# Patient Record
Sex: Female | Born: 1965 | Race: White | Hispanic: No | Marital: Married | Smoking: Never smoker
Health system: Southern US, Community
[De-identification: ages and names within clinical notes are randomized; demographics above are authoritative.]

## PROBLEM LIST (undated history)

## (undated) DIAGNOSIS — M8580 Other specified disorders of bone density and structure, unspecified site: Secondary | ICD-10-CM

## (undated) DIAGNOSIS — J302 Other seasonal allergic rhinitis: Secondary | ICD-10-CM

## (undated) DIAGNOSIS — Z862 Personal history of diseases of the blood and blood-forming organs and certain disorders involving the immune mechanism: Secondary | ICD-10-CM

## (undated) DIAGNOSIS — T7840XA Allergy, unspecified, initial encounter: Secondary | ICD-10-CM

## (undated) DIAGNOSIS — D649 Anemia, unspecified: Secondary | ICD-10-CM

## (undated) DIAGNOSIS — K635 Polyp of colon: Secondary | ICD-10-CM

## (undated) DIAGNOSIS — C4491 Basal cell carcinoma of skin, unspecified: Secondary | ICD-10-CM

## (undated) HISTORY — DX: Personal history of diseases of the blood and blood-forming organs and certain disorders involving the immune mechanism: Z86.2

## (undated) HISTORY — DX: Anemia, unspecified: D64.9

## (undated) HISTORY — DX: Other seasonal allergic rhinitis: J30.2

## (undated) HISTORY — DX: Allergy, unspecified, initial encounter: T78.40XA

## (undated) HISTORY — DX: Other specified disorders of bone density and structure, unspecified site: M85.80

## (undated) HISTORY — DX: Basal cell carcinoma of skin, unspecified: C44.91

## (undated) HISTORY — DX: Polyp of colon: K63.5

---

## 1995-08-03 DIAGNOSIS — C4491 Basal cell carcinoma of skin, unspecified: Secondary | ICD-10-CM

## 1995-08-03 HISTORY — DX: Basal cell carcinoma of skin, unspecified: C44.91

## 1996-08-02 HISTORY — PX: DILATION AND CURETTAGE OF UTERUS: SHX78

## 2004-03-19 ENCOUNTER — Other Ambulatory Visit: Admission: RE | Admit: 2004-03-19 | Discharge: 2004-03-19 | Payer: Self-pay | Admitting: Obstetrics and Gynecology

## 2005-06-03 ENCOUNTER — Other Ambulatory Visit: Admission: RE | Admit: 2005-06-03 | Discharge: 2005-06-03 | Payer: Self-pay | Admitting: Obstetrics and Gynecology

## 2005-07-15 ENCOUNTER — Ambulatory Visit: Payer: Self-pay | Admitting: Internal Medicine

## 2005-08-27 ENCOUNTER — Encounter (INDEPENDENT_AMBULATORY_CARE_PROVIDER_SITE_OTHER): Payer: Self-pay | Admitting: Specialist

## 2005-08-27 ENCOUNTER — Ambulatory Visit: Payer: Self-pay | Admitting: Internal Medicine

## 2005-08-27 DIAGNOSIS — D126 Benign neoplasm of colon, unspecified: Secondary | ICD-10-CM | POA: Insufficient documentation

## 2007-09-14 ENCOUNTER — Ambulatory Visit: Payer: Self-pay | Admitting: Gastroenterology

## 2007-09-14 DIAGNOSIS — D509 Iron deficiency anemia, unspecified: Secondary | ICD-10-CM

## 2007-09-14 LAB — CONVERTED CEMR LAB
Eosinophils Absolute: 0.1 10*3/uL (ref 0.0–0.6)
Lymphocytes Relative: 26.3 % (ref 12.0–46.0)
MCHC: 33.2 g/dL (ref 30.0–36.0)
MCV: 89.1 fL (ref 78.0–100.0)
Monocytes Relative: 10.8 % (ref 3.0–11.0)
Neutro Abs: 3.8 10*3/uL (ref 1.4–7.7)
Platelets: 220 10*3/uL (ref 150–400)
Sed Rate: 47 mm/hr — ABNORMAL HIGH (ref 0–25)

## 2007-09-15 ENCOUNTER — Encounter: Admission: RE | Admit: 2007-09-15 | Discharge: 2007-09-15 | Payer: Self-pay | Admitting: Gastroenterology

## 2007-09-15 ENCOUNTER — Ambulatory Visit: Payer: Self-pay | Admitting: Internal Medicine

## 2007-10-11 ENCOUNTER — Ambulatory Visit: Payer: Self-pay | Admitting: Internal Medicine

## 2007-11-08 ENCOUNTER — Ambulatory Visit: Payer: Self-pay | Admitting: Internal Medicine

## 2007-11-08 DIAGNOSIS — R1031 Right lower quadrant pain: Secondary | ICD-10-CM

## 2007-11-08 LAB — CONVERTED CEMR LAB
Eosinophils Absolute: 0 10*3/uL (ref 0.0–0.7)
Eosinophils Relative: 0.6 % (ref 0.0–5.0)
Monocytes Relative: 9.2 % (ref 3.0–12.0)
Neutrophils Relative %: 57.2 % (ref 43.0–77.0)
Platelets: 220 10*3/uL (ref 150–400)
Sed Rate: 17 mm/hr (ref 0–22)
WBC: 5.3 10*3/uL (ref 4.5–10.5)

## 2007-11-20 ENCOUNTER — Ambulatory Visit: Payer: Self-pay | Admitting: Cardiology

## 2007-12-04 ENCOUNTER — Telehealth: Payer: Self-pay | Admitting: Internal Medicine

## 2010-08-12 ENCOUNTER — Encounter: Payer: Self-pay | Admitting: Internal Medicine

## 2010-08-19 ENCOUNTER — Encounter (INDEPENDENT_AMBULATORY_CARE_PROVIDER_SITE_OTHER): Payer: Self-pay | Admitting: *Deleted

## 2010-09-03 NOTE — Letter (Signed)
Summary: Colonoscopy Letter  [facility]  [address] [place], [place] [address]   Phone: [phone]  Fax: [phone]      August 12, 2010 MRN: [id number]   [facility] [address] [place], [place]  [address]   Dear Ms. Benscoter,   According to your medical record, it is time for you to schedule a Colonoscopy. The American Cancer Society recommends this procedure as a method to detect early colon cancer. Patients with a family history of colon cancer, or a personal history of colon polyps or inflammatory bowel disease are at increased risk.  This letter has been generated based on the recommendations made at the time of your procedure. If you feel that in your particular situation this may no longer apply, please contact our office.  Please call our office at [phone] to schedule this appointment or to update your records at your earliest convenience.  Thank you for cooperating with [place] to provide you with the very best care possible.   Sincerely,  Hedwig Morton. Juanda Chance, M.D.  [facility] [phone]

## 2010-09-03 NOTE — Letter (Signed)
Summary: Pre Visit Letter Revised  [facility]  [address] [place], [place] [address]   Phone: [phone]  Fax: [phone]        08/19/2010 MRN: [id number] [facility] [address] [place], [place]  [address]             Procedure Date:  10-06-10   Welcome to the Gastroenterology Division at [facility].    You are scheduled to see a nurse for your pre-procedure visit on 09-22-10 at 1:00p.m. on the 3rd floor at [facility], [address]  We ask that you try to arrive at our office 15 minutes prior to your appointment time to allow for check-in.  Please take a minute to review the attached form.  If you answer "Yes" to one or more of the questions on the first page, we ask that you call the person listed at your earliest opportunity.  If you answer "No" to all of the questions, please complete the rest of the form and bring it to your appointment.    Your nurse visit will consist of discussing your medical and surgical history, your immediate family medical history, and your medications.   If you are unable to list all of your medications on the form, please bring the medication bottles to your appointment and we will list them.  We will need to be aware of both prescribed and over the counter drugs.  We will need to know exact dosage information as well.    Please be prepared to read and sign documents such as consent forms, a financial agreement, and acknowledgement forms.  If necessary, and with your consent, a friend or relative is welcome to sit-in on the nurse visit with you.  Please bring your insurance card so that we may make a copy of it.  If your insurance requires a referral to see a specialist, please bring your referral form from your primary care physician.  No co-pay is required for this nurse visit.     If you cannot keep your appointment, please call [phone] to cancel or reschedule prior to your appointment date.  This allows  [place] the opportunity to schedule an appointment for another patient in need of care.    Thank you for choosing [facility] for your medical needs.  We appreciate the opportunity to care for you.  Please visit [place] at our website  to learn more about our practice.  Sincerely, The Gastroenterology Division

## 2010-09-21 ENCOUNTER — Encounter (INDEPENDENT_AMBULATORY_CARE_PROVIDER_SITE_OTHER): Payer: Self-pay | Admitting: *Deleted

## 2010-09-22 ENCOUNTER — Encounter: Payer: Self-pay | Admitting: Internal Medicine

## 2010-09-29 NOTE — Letter (Signed)
Summary: [facility] Instructions  [facility]  [address] [place], [place] [address]   Phone: [phone]  Fax: [phone]       Kendra Walter    10/18/1965    MRN: [id number]       Procedure Day Dorna Bloom:  Jake Shark  10/06/10     Arrival Time: 8:00AM     Procedure Time:  9:00AM     Location of Procedure:                    _X_  [facility] (4th Floor)   PREPARATION FOR COLONOSCOPY WITH MIRALAX  Starting 5 days prior to your procedure 10/01/10 do not eat nuts, seeds, popcorn, corn, beans, peas,  salads, or any raw vegetables.  Do not take any fiber supplements (e.g. Metamucil, Citrucel, and Benefiber). ____________________________________________________________________________________________________   THE DAY BEFORE YOUR PROCEDURE         DATE: 10/05/10  DAY: MONDAY  1   Drink clear liquids the entire day-NO SOLID FOOD  2   Do not drink anything colored red or purple.  Avoid juices with pulp.  No orange juice.  3   Drink at least 64 oz. (8 glasses) of fluid/clear liquids during the day to prevent dehydration and help the prep work efficiently.  CLEAR LIQUIDS INCLUDE: Water Jello Ice Popsicles Tea (sugar ok, no milk/cream) Powdered fruit flavored drinks Coffee (sugar ok, no milk/cream) Gatorade Juice: apple, white grape, white cranberry  Lemonade Clear bullion, consomm, broth Carbonated beverages (any kind) Strained chicken noodle soup Hard Candy  4   Mix the entire bottle of Miralax with 64 oz. of Gatorade/Powerade in the morning and put in the refrigerator to chill.  5   At 3:00 pm take 2 Dulcolax/Bisacodyl tablets.  6   At 4:30 pm take one Reglan/Metoclopramide tablet.  7  Starting at 5:00 pm drink one 8 oz glass of the Miralax mixture every 15-20 minutes until you have finished drinking the entire 64 oz.  You should finish drinking prep around 7:30 or 8:00 pm.  8   If you are nauseated, you may take the 2nd Reglan/Metoclopramide tablet  at 6:30 pm.        9    At 8:00 pm take 2 more DULCOLAX/Bisacodyl tablets.     THE DAY OF YOUR PROCEDURE      DATE:  10/06/10   DAY: Jake Shark  You may drink clear liquids until 7:00AM  (2 HOURS BEFORE PROCEDURE).   MEDICATION INSTRUCTIONS  Unless otherwise instructed, you should take regular prescription medications with a small sip of water as early as possible the morning of your procedure.        OTHER INSTRUCTIONS  You will need a responsible adult at least 46 years of age to accompany you and drive you home.   This person must remain in the waiting room during your procedure.  Wear loose fitting clothing that is easily removed.  Leave jewelry and other valuables at home.  However, you may wish to bring a book to read or an iPod/MP3 player to listen to music as you wait for your procedure to start.  Remove all body piercing jewelry and leave at home.  Total time from sign-in until discharge is approximately 2-3 hours.  You should go home directly after your procedure and rest.  You can resume normal activities the day after your procedure.  The day of your procedure you should not:   Drive   Make  legal decisions   Operate machinery   Drink alcohol   Return to work  You will receive specific instructions about eating, activities and medications before you leave.   The above instructions have been reviewed and explained to me by   Wyona Almas RN  September 22, 2010 1:17 PM     I fully understand and can verbalize these instructions _____________________________ Date _______

## 2010-09-29 NOTE — Miscellaneous (Signed)
Summary: [facility] Previsit/prep  Clinical Lists Changes  Medications: Added new medication of DULCOLAX 5 MG  TBEC (BISACODYL) Day before procedure take 2 at 3pm and 2 at 8pm. - Signed Added new medication of METOCLOPRAMIDE HCL 10 MG  TABS (METOCLOPRAMIDE HCL) As per prep instructions. - Signed Added new medication of MIRALAX   POWD (POLYETHYLENE GLYCOL 3350) As per prep  instructions. - Signed Rx of DULCOLAX 5 MG  TBEC (BISACODYL) Day before procedure take 2 at 3pm and 2 at 8pm.;  #4 x 0;  Signed;  Entered by: Wyona Almas RN;  Authorized by: Hart Carwin MD;  Method used: Electronically to [facility] [address] [place], [place]  [address], Ph: [phone], Fax: [phone] Rx of METOCLOPRAMIDE HCL 10 MG  TABS (METOCLOPRAMIDE HCL) As per prep instructions.;  #2 x 0;  Signed;  Entered by: Wyona Almas RN;  Authorized by: Hart Carwin MD;  Method used: Electronically to [facility] [address] [place], [place]  [address], Ph: [phone], Fax: [phone] Rx of MIRALAX   POWD (POLYETHYLENE GLYCOL 3350) As per prep  instructions.;  #255gm x 0;  Signed;  Entered by: Wyona Almas RN;  Authorized by: Hart Carwin MD;  Method used: Electronically to [facility] [address] [place], [place]  [address], Ph: [phone], Fax: [phone] Observations: Added new observation of NKA: T (09/22/2010 12:48)    Prescriptions: MIRALAX   POWD (POLYETHYLENE GLYCOL 3350) As per prep  instructions.  #255gm x 0   Entered by:   Wyona Almas RN   Authorized by:   Hart Carwin MD   Signed by:   Wyona Almas RN on 09/22/2010   Method used:   Electronically to        [facility] [address] (retail)       [address]       [place], [place]  [address]       Ph: [phone]       Fax: [phone]   RxID:   [id number] METOCLOPRAMIDE HCL 10 MG  TABS (METOCLOPRAMIDE HCL) As per prep instructions.  #2 x 0   Entered by:   Wyona Almas RN  Authorized by:   Hart Carwin MD   Signed by:   Wyona Almas RN on 09/22/2010   Method used:   Electronically to        [facility] [address] (retail)       [address]       [place], [place]  [address]       Ph: [phone]       Fax: [phone]   RxID:   [id number] DULCOLAX 5 MG  TBEC (BISACODYL) Day before procedure take 2 at 3pm and 2 at 8pm.  #4 x 0   Entered by:   Wyona Almas RN   Authorized by:   Hart Carwin MD   Signed by:   Wyona Almas RN on 09/22/2010   Method used:   Electronically to        [facility] [address] (retail)       [address]       [place], [place]  [address]       Ph: [phone]       Fax: [phone]   RxID:   [id number]

## 2010-10-06 ENCOUNTER — Other Ambulatory Visit: Payer: Self-pay | Admitting: Internal Medicine

## 2010-10-06 ENCOUNTER — Other Ambulatory Visit (AMBULATORY_SURGERY_CENTER): Payer: BC Managed Care – PPO | Admitting: Internal Medicine

## 2010-10-06 DIAGNOSIS — Z1211 Encounter for screening for malignant neoplasm of colon: Secondary | ICD-10-CM

## 2010-10-06 DIAGNOSIS — D126 Benign neoplasm of colon, unspecified: Secondary | ICD-10-CM

## 2010-10-06 DIAGNOSIS — Z86010 Personal history of colonic polyps: Secondary | ICD-10-CM

## 2010-10-06 DIAGNOSIS — Z800 Family history of malignant neoplasm of digestive organs: Secondary | ICD-10-CM

## 2010-10-12 ENCOUNTER — Encounter: Payer: Self-pay | Admitting: Internal Medicine

## 2010-10-13 NOTE — Procedures (Addendum)
Summary: Colonoscopy  Patient: Kendra Walter Note: All result statuses are Final unless otherwise noted.  Tests: (1) Colonoscopy (COL)   COL Colonoscopy           DONE     [facility]     [address]     [place], [place]  [address]          COLONOSCOPY PROCEDURE REPORT          PATIENT:  Kendra Walter, Kendra Walter  MR#:  [id number]     BIRTHDATE:  1965-11-10, 44 yrs. old  GENDER:  female     ENDOSCOPIST:  Hedwig Morton. Juanda Chance, MD     REF. BY:  Harold Hedge, M.D.     PROCEDURE DATE:  10/06/2010     PROCEDURE:  Colonoscopy 62130     ASA CLASS:  Class I     INDICATIONS:  family history of colon cancer parent with colon     cancer, hyperplastic polyp in 2007     acute inflammatory process in RLQ in 2009, possibly a ruptured     enteric cyst or Meckel's     MEDICATIONS:   Versed 10 mg, Fentanyl 100 mcg          DESCRIPTION OF PROCEDURE:   After the risks benefits and     alternatives of the procedure were thoroughly explained, informed     consent was obtained.  Digital rectal exam was performed and     revealed no rectal masses.   The LB [id number]-[id number] endoscope     was introduced through the anus and advanced to the cecum, which     was identified by both the appendix and ileocecal valve, without     limitations.  The quality of the prep was good, using MiraLax.     The instrument was then slowly withdrawn as the colon was fully     examined.     <<PROCEDUREIMAGES>>          FINDINGS:  Four polyps were found. at 110 cm 2 flat polyps 4-5 mm     each, at 15 and 30 cm 2 diminutive polyps remloved The polyps were     removed using cold biopsy forceps (see image4 and image5).  This     was otherwise a normal examination of the colon (see image6,     image3, image2, and image1).   Retroflexed views in the rectum     revealed no abnormalities.    The scope was then withdrawn from     the patient and the procedure completed.          COMPLICATIONS:  None     ENDOSCOPIC  IMPRESSION:     1) Four polyps     2) Otherwise normal examination     s/p polypectomy x 4     RECOMMENDATIONS:     1) Await pathology results     2) High fiber diet.     REPEAT EXAM:  In 5 year(s) for.          ______________________________     Hedwig Morton. Juanda Chance, MD          CC:          n.     eSIGNED:   Hedwig Morton. Brodie at 10/06/2010 09:44 AM          Donn Pierini, [id number]  Note: An exclamation mark (!) indicates a result that was not dispersed into the flowsheet. Document Creation Date:  10/06/2010 9:45 AM _______________________________________________________________________  (1) Order result status: Final Collection or observation date-time: 10/06/2010 09:35 Requested date-time:  Receipt date-time:  Reported date-time:  Referring Physician:   Ordering Physician: Lina Sar [phone]) Specimen Source:  Source: Launa Grill Order Number: [id number] Lab site:   Appended Document: Colonoscopy     Procedures Next Due Date:    Colonoscopy: 10/2015

## 2010-10-20 NOTE — Letter (Signed)
Summary: Patient Notice- Polyp Results   Gastroenterology  [address] [place], [place] [address]   Phone: [phone]  Fax: [phone]        October 12, 2010 MRN: [id number]    [facility] [address] [place], [place]  [address]    Dear Ms. 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  Should you develop new or worsening symptoms of abdominal pain, bowel habit changes or bleeding from the rectum or bowels, please schedule an evaluation with either your primary care physician or with me.  Additional information/recommendations:  _x_ No further action with gastroenterology is needed at this time. Please      follow-up with your primary care physician for your other healthcare      needs.  __ Please call [phone] to schedule a return visit to review your      situation.  __ Please keep your follow-up visit as already scheduled.  __ Continue treatment plan as outlined the day of your exam.  Please call us if you are having persistent problems or have questions about your condition that have not been fully answered at this time.  Sincerely,  Hart Carwin MD  This letter has been electronically signed by your physician.  Appended Document: Patient Notice- Polyp Results letter mailed

## 2010-12-15 NOTE — Assessment & Plan Note (Signed)
[facility]                         GASTROENTEROLOGY OFFICE NOTE   Kendra Walter, Kendra Walter                       MRN:          [id number]  DATE:10/11/2007                            DOB:          12-31-1965    Kendra Walter is a 45 year old white female with acute abdominal pain  requiring hospitalization on September 09, 2007 in [place] where she was  visiting.  The pain developed rather suddenly while driving to [facility]  and became excruciating by the time she reached [place].  She was seen  in the emergency room where CT scan of the abdomen showed inflammatory  change of the right lower quadrant.  She was hospitalized for 4 days,  but not treated with antibiotics.  Appendicitis was ruled out.  Pelvic  ultrasound was negative.  Initial CT scan showed cholelithiasis, but her  HIDA scan was negative.  She was kept to bowel rest and improved within  4 days, enough to go home.  CT scan was repeated here in [place] on  September 15, 2007, again with finding of a right lower quadrant  stranding with well-circumscribed rounded lesion with peripheral  enhancement of central low density measuring 23 x 27 mm.  There were no  tumors as this lesion connected to the bowel and it was certainly  separate from the appendix.  The patient was given a course of Flagyl  and Cipro, which she took for 2 weeks.  She is now 90% better having  some residual tenseness around the umbilical area and right lower  quadrant.  She is back on regular diet.  She has gained her weight back,  and she is having regular bowel movements.  There has been no fever at  any point.   MEDICATIONS:  Multiple vitamins and calcium.   PHYSICAL EXAMINATION:  Blood pressure 116/64.  Pulse 80.  Weight 120  pounds.  She was oriented, in no distress.  LUNGS:  Clear to auscultation.  CORE:  Normal S1 and normal S2.  ABDOMEN:  Soft, nondistended, with normoactive bowel sounds, tenderness  in right lower  quadrant.  No fullness, but definite tenderness in the  ileocecal area.   Last colonoscopy was January 2007 because of family history of colon  cancer.  Exam showed multiple polyps, mostly hyperplastic.   IMPRESSION:  A 45 year old white female with acute inflammatory process  in right lower quadrant documented on CT scan which responded  spontaneously and started to improve prior to taking antibiotics.  Rule  out ruptured cyst of cecal volvulus.  Rule out gynecological problem.  Doubt this was diverticulitis.  The patient had essentially normal  colonoscopy, except for polyps just 2 years ago.   PLAN:  Continue the same over the last 2 weeks.  CT scan of the abdomen  with attention to right lower quadrant in the next several weeks.  As  long as she is getting better we will continue on low dose Cipro 250  p.o. b.i.d. and see her in 4 weeks.  At that point I would repeat the CT  scan to see whether the process has resolved.  If not, consider repeat  colonoscopy or consider  small bowel follow through to determine ileum.  Also consider Meckel's  scan to rule out Meckel's diverticulitis.     Hedwig Morton. Juanda Chance, MD  Electronically Signed    DMB/MedQ  DD: 10/11/2007  DT: 10/12/2007  Job #: [id number]   cc:   Guy Sandifer. Henderson Cloud, M.D.

## 2010-12-15 NOTE — Assessment & Plan Note (Signed)
Kendra Walter                         GASTROENTEROLOGY OFFICE NOTE   Kendra Walter, Kendra Walter                       MRN:          [id number]  DATE:11/08/2007                            DOB:          1966/03/15    Kendra Walter is a 45 year old white female patient of Dr. Henderson Cloud who had  an acute abdominal event while visiting [place] in February 2009.  CT  scan at that time showed inflammatory process in the right lower  quadrant which was poorly defined, and raised a question of duplication  cyst or possibly an abscess, or possibly Meckel's diverticulitis.  She  responded to several weeks of antibiotics.  When we saw her in March  2009 she was improved.  At that point she took another 4 weeks of Cipro  250 p.o. b.i.d. which she has completed recently.  She is now completely  asymptomatic, being able to tolerate regular diet.  She denies any pain.  Her bowel habits have been regular.   We have seen Kendra Walter in the past for a family history of colon  cancer and colorectal screening.  She had a colonoscopy in 2007.  She  also has iron deficiency anemia.  Her sed rate was up to 47 on last  appointment.   PHYSICAL EXAMINATION:  Blood pressure 108/66, pulse 68, weight 119  pounds.  Lungs were clear to auscultation.  COR:  Normal S1, normal S2.  ABDOMEN:  Was soft, relaxed, flat with tenderness to the right of the  umbilicus.  It was a small area of about 3 square centimeters which was  somewhat uncomfortable on palpation, but I could not feel any  fluctuation.  There was no umbilical hernia, and the pain did not seem  to radiate to right lower quadrant.   IMPRESSION:  A 45 year old white female with acute abdominal event of  unknown etiology characterized by inflammatory process, possibly a cyst  or an abscess, in the right lower quadrant 2 months ago.  She has  responded by symptomatic relief after a prolonged course of antibiotics.   PLAN:  1. Repeat  CT scan of the abdomen and pelvis with attention to right      lower quadrant to look for resolution of the inflammatory changes      in the right pelvis  2. Depending on the results of the CT scan, consider Meckel's scan.  3. Today CBC and sed rate.     Hedwig Morton. Juanda Chance, MD  Electronically Signed    DMB/MedQ  DD: 11/08/2007  DT: 11/08/2007  Job #: [id number]   cc:   Guy Sandifer. Henderson Cloud, M.D.

## 2010-12-15 NOTE — Assessment & Plan Note (Signed)
[facility]                                 ON-CALL NOTE   Kendra, Walter                       MRN:          [id number]  DATE:09/15/2007                            DOB:          [birth year]    This is a patient of Hedwig Morton. Juanda Chance, M.D.   The patient had a CT scan done earlier today at [facility] CT scanner.  This was done for right lower quadrant pain.  She had been evaluated by  Mike Gip, PA-C and Vania Rea. Jarold Motto, MD, Clementeen Graham, FACP, FAGA in the  office earlier this week and this scan was arranged for.  The initial CT  scan raised the question of a possible abscess in her right lower  quadrant measuring 2-3 cm, but it was not of the greatest quality and so  she was called back by the radiologist to have a repeat scanner done.  Unfortunately when she was at the [facility], the CT scan machine  broke and so she then went to [facility] for a scan there.  I  spoke with Dr. Logan Bores who is the radiologist who read the CAT scan and  said that she does not have obvious inflammation, but there is a  somewhat rim enhancing fluid collection in her right lower quadrant of  unclear etiology and unclear clinical significance.  I spoke with the  patient on the phone and she actually feels very well.  She has had no  fevers or chills.  The pain that brought her to attention last week has  subsided.  It is still present and she does have mild tenderness when  she pushes on her abdomen, but this is all much better than she was one  week ago.  She has never had any infections in the past from talking  with her on the phone I cannot see why she would have any pocket of  infection currently.  Since she feels fairly well, the pain is better,  she has had no fevers and chills, I think it is safe to put her on oral  antibiotics and so I called her in a prescription for Ciprofloxacin 500  mg p.o. b.i.d. for 10 days as well as Flagyl 250 mg p.o. t.i.d. for  10  days.  She is going to call our office on Monday.  She already has a  scheduled appointment with Dr. Juanda Chance about a month from now, but I  think that it is probably best to evaluate her sooner than that in the  next week to two.  She knows that if pain gets worse or if she has any  trouble, that I am oncall on weekends and she should call then.     Rachael Fee, MD  Electronically Signed    DPJ/MedQ  DD: 09/15/2007  DT: 09/18/2007  Job #: [id number]   cc:   Hedwig Morton. Juanda Chance, MD  Vania Rea. Jarold Motto, MD, Caleen Essex, FAGA

## 2010-12-15 NOTE — Assessment & Plan Note (Signed)
[facility]                         GASTROENTEROLOGY OFFICE NOTE   Kendra, Walter                       MRN:          [id number]  DATE:09/14/2007                            DOB:          Aug 16, 1965    PROBLEM:  Abdominal pain times one week, constant.   HISTORY:  Kendra Walter is a pleasant, 45 year old white female, known to Dr.  Juanda Chance, who was last seen here in December of 2006.  She does have  family history of colon cancer in her mother and had undergone  colonoscopy with Dr. Juanda Chance in January of 2007.  She had multiple colon  polyps, which were removed, and plan was for followup in five years.  Her polyps were hyperplastic.   Patient has not had any previous abdominal surgery.  She presents now  with complaints of onset of abdominal pain six days ago, while she was  on her way to [facility] to visit.  She developed what she initially  thought was just a stomach ache in the mid-abdomen, but then this pain  became progressively more severe, and she was ultimately taken to the  emergency room outside of [place] by her friends.  She was admitted to  the hospital there and underwent workup, which was unrevealing.  They  suggested colonoscopy as the next step, but she was feeling a bit better  and wanted to return home to [facility] for further evaluation.  Over  this past four days, she says her pain is not quite as severe as it was  last weekend, but is persistent.  She has not had any nausea or  vomiting, no fever or chills, no diarrhea, melena or hematochezia.  The  pain is located in the mid-abdomen, below the umbilicus, and seems to be  aggravated by p.o. intake.  She says she has not been eating much, since  she is afraid she is going to make it worse.   What workup we have available shows that she had undergone a CT scan of  the abdomen without any contrast, which did reveal cholelithiasis, but  was otherwise unremarkable.  She had labs done  with a white count of 8,  hemoglobin 12.1, hematocrit of 36.7.  Urinalysis was negative.  Electrolytes unremarkable.  Liver function studies normal.  She also had  a HIDA scan done, which was unremarkable, and a pelvic ultrasound, which  showed a small amount of fluid in the cul-de-sac with inability to  identify the left ovary, otherwise negative.  Since returning home,  patient has been seen by Dr. Henderson Cloud, her gynecologist, had a pelvic  exam done which was negative, and was referred here for further  evaluation.   CURRENT MEDICATIONS:  Multivitamin and calcium supplement daily.   ALLERGIES:  No known drug allergies.   EXAM:  Well-developed, thin, white female, in no acute distress.  Weight is 119.2, blood pressure 100/62, pulse is 72.  HEENT:  Atraumatic, normocephalic, EOMI, PERLA.  Sclerae anicteric.  NECK:  Supple.  CARDIOVASCULAR:  Regular rate and rhythm with S1 and S2.  PULMONARY:  Clear to A and P.  ABDOMEN:  Soft.  She is tender to the right of midline, more in the  right mid quadrant, right lower quadrant.  There is no guarding or  rebound, no palpable mass or hepatosplenomegaly.  RECTAL EXAM:  Not done today.   IMPRESSION:  1. A 45 year old white female with one-week history of persistent mid      to right lower quadrant abdominal pain, etiology not clear.      Suspect that this is a small bowel or colonic process.  2. History of colon polyps.  3. Family history of colon cancer.   PLAN:  1. Schedule CT scan of the abdomen and pelvis with IV and oral      contrast.  2. Check CBC and sed rate today.  3. Offered patient analgesics, which she declined at this time.  4. Further plans pending CT.      Mike Gip, PA-C  Electronically Signed      Kendra Rea. Jarold Motto, MD, Caleen Essex, FAGA  Electronically Signed   AE/MedQ  DD: 09/14/2007  DT: 09/15/2007  Job #: [id number]   cc:   Hedwig Morton. Juanda Chance, MD  Guy Sandifer Henderson Cloud, M.D.

## 2011-10-25 ENCOUNTER — Other Ambulatory Visit: Payer: Self-pay | Admitting: Obstetrics and Gynecology

## 2011-10-25 DIAGNOSIS — R928 Other abnormal and inconclusive findings on diagnostic imaging of breast: Secondary | ICD-10-CM

## 2011-10-27 ENCOUNTER — Ambulatory Visit
Admission: RE | Admit: 2011-10-27 | Discharge: 2011-10-27 | Disposition: A | Discharge status: Home / Self Care | Payer: BC Managed Care – PPO | Source: Ambulatory Visit | Attending: Obstetrics and Gynecology | Admitting: Obstetrics and Gynecology

## 2011-10-27 DIAGNOSIS — R928 Other abnormal and inconclusive findings on diagnostic imaging of breast: Secondary | ICD-10-CM

## 2012-08-02 HISTORY — PX: LAPAROSCOPIC OOPHERECTOMY: SHX6507

## 2013-01-10 ENCOUNTER — Other Ambulatory Visit: Payer: Self-pay | Admitting: Obstetrics and Gynecology

## 2013-06-19 ENCOUNTER — Other Ambulatory Visit: Payer: Self-pay | Admitting: Obstetrics and Gynecology

## 2013-06-27 ENCOUNTER — Ambulatory Visit
Admission: RE | Admit: 2013-06-27 | Discharge: 2013-06-27 | Disposition: A | Discharge status: Home / Self Care | Payer: BC Managed Care – PPO | Source: Ambulatory Visit | Attending: Obstetrics and Gynecology | Admitting: Obstetrics and Gynecology

## 2013-06-27 ENCOUNTER — Other Ambulatory Visit: Payer: Self-pay | Admitting: Obstetrics and Gynecology

## 2013-07-04 ENCOUNTER — Other Ambulatory Visit: Payer: BC Managed Care – PPO

## 2014-12-06 ENCOUNTER — Other Ambulatory Visit (HOSPITAL_COMMUNITY): Payer: Self-pay | Admitting: Obstetrics and Gynecology

## 2014-12-07 IMAGING — US US BREAST*L*
1 series · 5 of 5 positions shown · non-contrast
Comparison: Prior studies from [REDACTED] dating back to
10/03/2008

CLINICAL DATA: Patient has felt a lump in the 12 o'clock position
of the left breast for 2 weeks.

EXAM:
DIGITAL DIAGNOSTIC LEFT MAMMOGRAM WITH CAD
ULTRASOUND LEFT BREAST

[Series 1: us breast*left* · 5 of 5 slices shown]
[im 1/5]
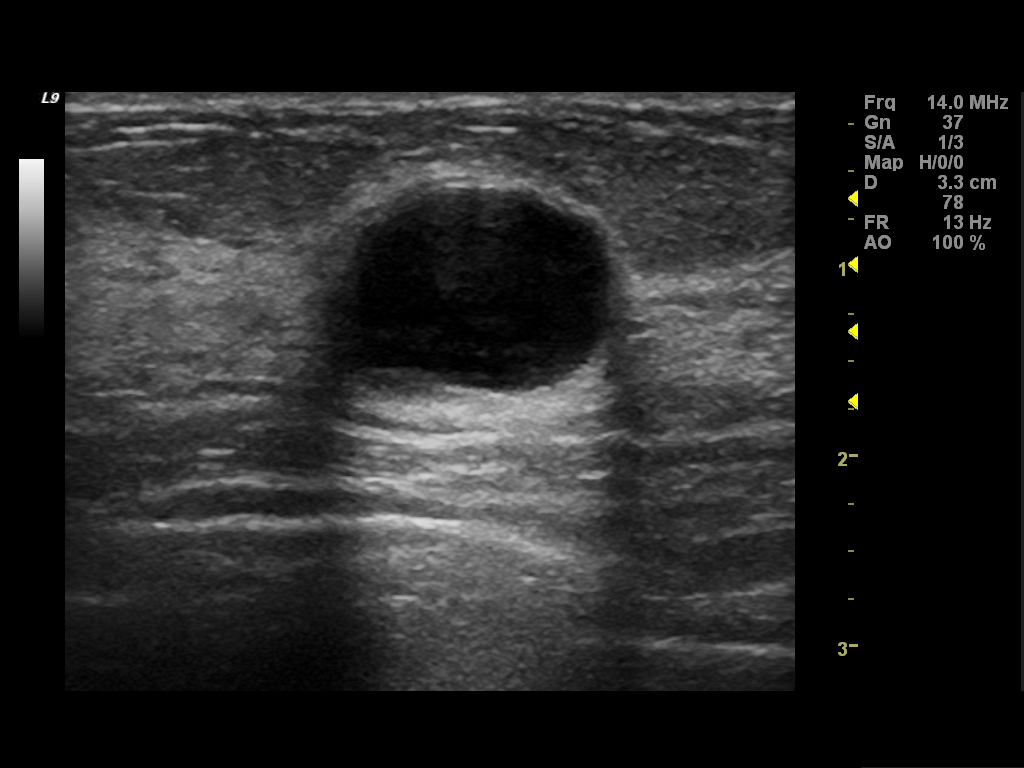
[im 2/5]
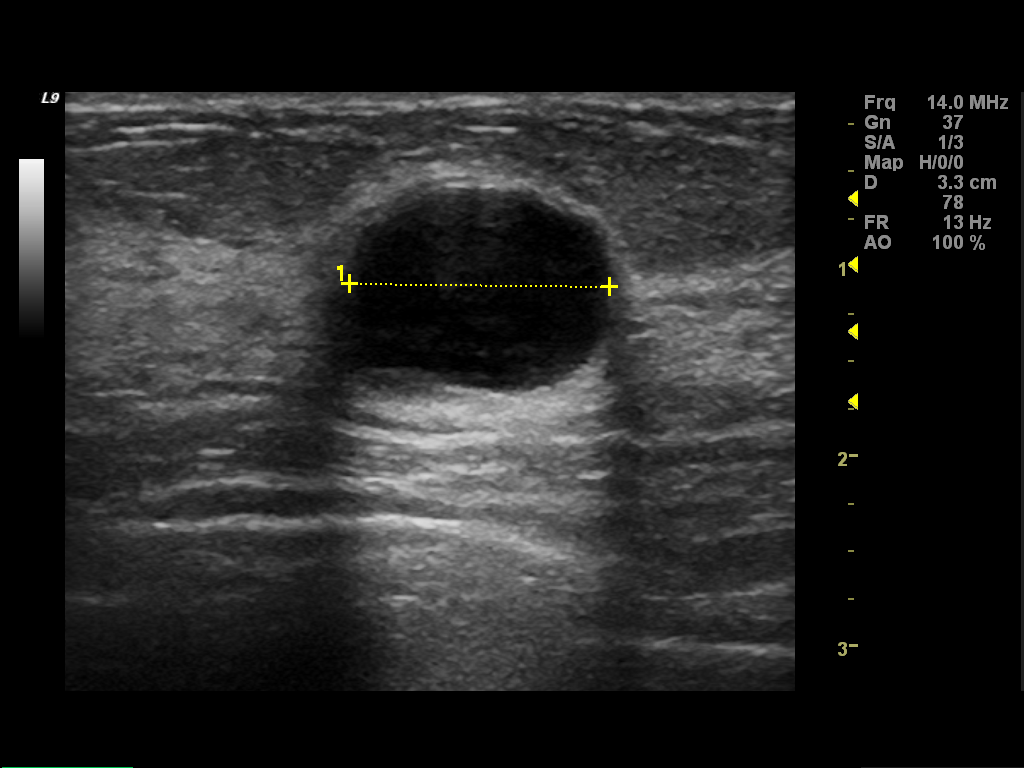
[im 3/5]
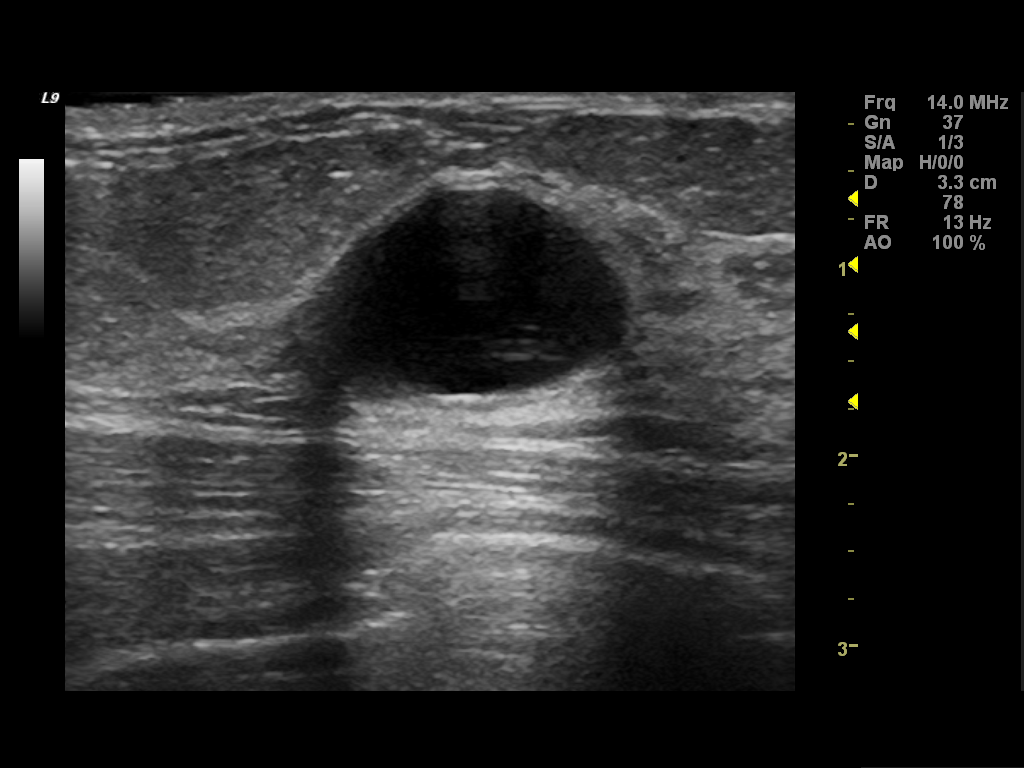
[im 4/5]
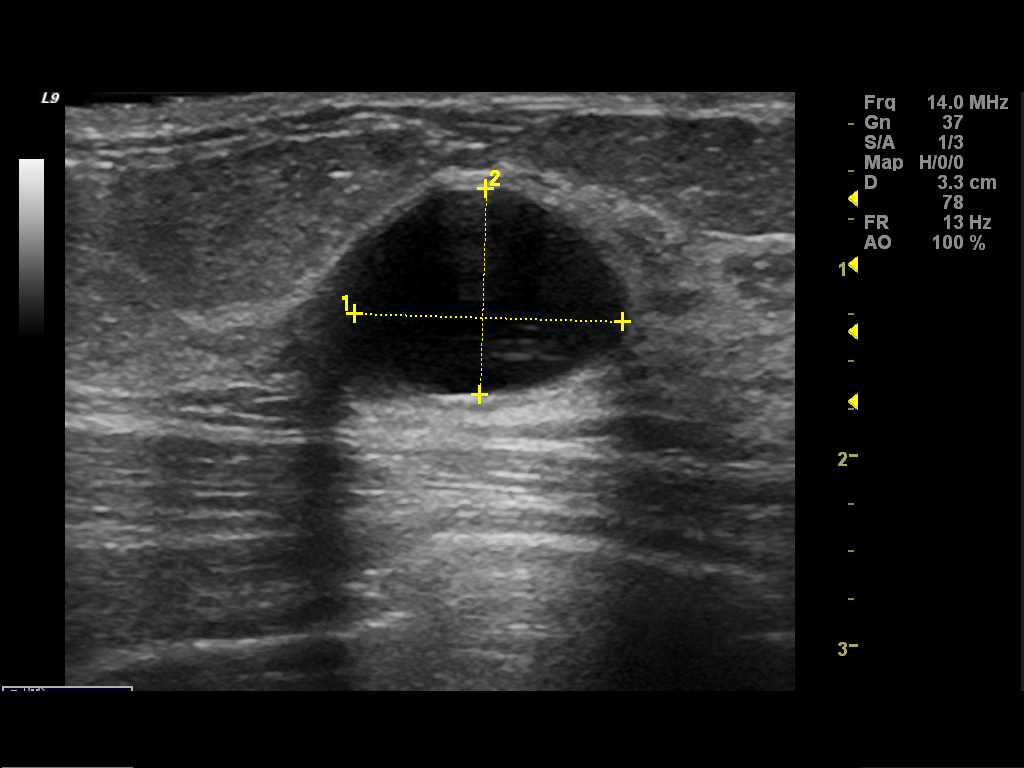
[im 5/5]
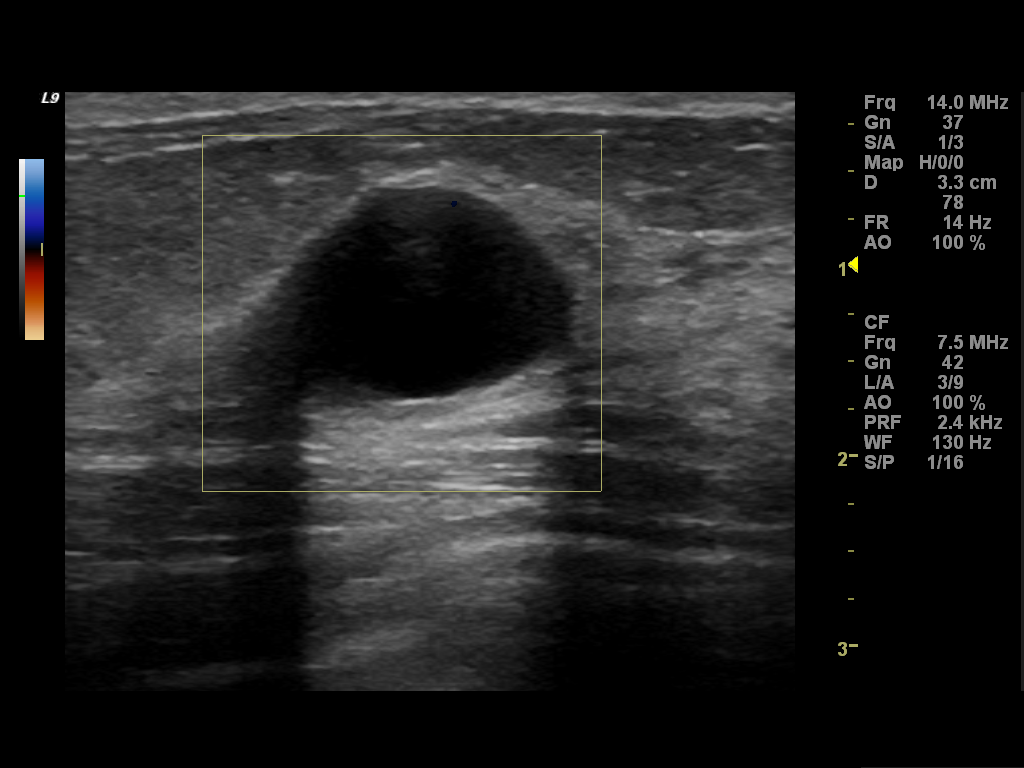

[5 of 5 positions shown; findings below may reference images not displayed]

ACR Breast Density Category c: The breasts are heterogeneously
dense, which may obscure small masses.
FINDINGS: Images of the right breast were inadvertently obtained. These are
interpreted as well. There is a rounded partially obscured partially
circumscribed mass in the 12 o'clock position of the left breast
which corresponds to the palpable abnormality. No other abnormality
is noted.

Mammographic images were processed with CAD.

On physical exam, I palpate a 1.5 cm mass at 12 o'clock 3 cm from
the left nipple. .

Ultrasound is performed, showing a simple cyst in this location
measuring 1.4 x 1.4 x 1.1 cm .
IMPRESSION: The palpable finding in the left breast corresponds to a benign
cyst. No mammographic or sonographic evidence of malignancy.

RECOMMENDATION:
Yearly screening mammography is suggested.

I have discussed the findings and recommendations with the patient.
Results were also provided in writing at the conclusion of the
visit.

BI-RADS CATEGORY  2: Benign Finding(s)

## 2014-12-09 LAB — CYTOLOGY - PAP

## 2015-02-06 ENCOUNTER — Encounter: Payer: Self-pay | Admitting: Internal Medicine

## 2015-10-17 ENCOUNTER — Encounter: Payer: Self-pay | Admitting: Gastroenterology

## 2015-10-31 ENCOUNTER — Encounter: Payer: Self-pay | Admitting: Gastroenterology

## 2015-11-28 ENCOUNTER — Ambulatory Visit (AMBULATORY_SURGERY_CENTER): Payer: Self-pay | Admitting: *Deleted

## 2015-11-28 VITALS — Ht 62.0 in | Wt 131.4 lb

## 2015-11-28 DIAGNOSIS — Z86010 Personal history of colonic polyps: Secondary | ICD-10-CM

## 2015-11-28 NOTE — Progress Notes (Signed)
No allergies to eggs or soy. No problems with anesthesia.  Pt given Emmi instructions for colonoscopy  No oxygen use  No diet drug use  

## 2015-12-01 ENCOUNTER — Encounter: Payer: Self-pay | Admitting: Gastroenterology

## 2015-12-12 ENCOUNTER — Ambulatory Visit (AMBULATORY_SURGERY_CENTER): Payer: 59 | Admitting: Gastroenterology

## 2015-12-12 ENCOUNTER — Encounter: Payer: Self-pay | Admitting: Gastroenterology

## 2015-12-12 VITALS — BP 113/66 | HR 50 | Temp 97.5°F | Resp 13 | Ht 62.0 in | Wt 131.0 lb

## 2015-12-12 DIAGNOSIS — Z800 Family history of malignant neoplasm of digestive organs: Secondary | ICD-10-CM

## 2015-12-12 DIAGNOSIS — Z86010 Personal history of colonic polyps: Secondary | ICD-10-CM

## 2015-12-12 DIAGNOSIS — D128 Benign neoplasm of rectum: Secondary | ICD-10-CM | POA: Diagnosis not present

## 2015-12-12 DIAGNOSIS — K635 Polyp of colon: Secondary | ICD-10-CM

## 2015-12-12 DIAGNOSIS — D120 Benign neoplasm of cecum: Secondary | ICD-10-CM

## 2015-12-12 HISTORY — DX: Polyp of colon: K63.5

## 2015-12-12 MED ORDER — SODIUM CHLORIDE 0.9 % IV SOLN: 500.0000 mL | INTRAVENOUS | Status: DC

## 2015-12-12 NOTE — Op Note (Signed)
[facility] Patient Name: Kendra Walter Procedure Date: 12/12/2015 1:54 PM MRN: [id number] Endoscopist: Mauri Pole , MD Age: 50 Referring MD:  Date of Birth: [birth year] Gender: Female Procedure:                Colonoscopy Indications:              Surveillance: Personal history of adenomatous                            polyps on last colonoscopy > 5 years ago, Family                            history of colon cancer in a first-degree relative Medicines:                Monitored Anesthesia Care Procedure:                Pre-Anesthesia Assessment:                           - Prior to the procedure, a History and Physical                            was performed, and patient medications and                            allergies were reviewed. The patient's tolerance of                            previous anesthesia was also reviewed. The risks                            and benefits of the procedure and the sedation                            options and risks were discussed with the patient.                            All questions were answered, and informed consent                            was obtained. Prior Anticoagulants: The patient has                            taken no previous anticoagulant or antiplatelet                            agents. ASA Grade Assessment: II - A patient with                            mild systemic disease. After reviewing the risks                            and benefits, the patient was deemed in  satisfactory condition to undergo the procedure.                           After obtaining informed consent, the colonoscope                            was passed under direct vision. Throughout the                            procedure, the patient's blood pressure, pulse, and                            oxygen saturations were monitored continuously. The                            Model CF-HQ190L [id number])  scope was introduced                            through the anus and advanced to the the terminal                            ileum, with identification of the appendiceal                            orifice and IC valve. The colonoscopy was performed                            without difficulty. The patient tolerated the                            procedure well. The quality of the bowel                            preparation was excellent. The terminal ileum,                            ileocecal valve, appendiceal orifice, and rectum                            were photographed. Scope In: 2:03:12 PM Scope Out: 2:21:11 PM Scope Withdrawal Time: 0 hours 10 minutes 6 seconds  Total Procedure Duration: 0 hours 17 minutes 59 seconds  Findings:                 The perianal and digital rectal examinations were                            normal.                           Three sessile polyps were found in the rectum and                            appendiceal orifice. The polyps were 2 to 4 mm in  size. These polyps were removed with a cold biopsy                            forceps. Resection and retrieval were complete.                           Non-bleeding internal hemorrhoids were found during                            retroflexion. The hemorrhoids were small. Complications:            No immediate complications. Estimated Blood Loss:     Estimated blood loss: none. Impression:               - Three 2 to 4 mm polyps in the rectum and at the                            appendiceal orifice, removed with a cold biopsy                            forceps. Resected and retrieved.                           - Non-bleeding internal hemorrhoids. Recommendation:           - Patient has a contact number available for                            emergencies. The signs and symptoms of potential                            delayed complications were discussed with the                             patient. Return to normal activities tomorrow.                            Written discharge instructions were provided to the                            patient.                           - Resume previous diet.                           - Continue present medications.                           - Await pathology results.                           - Repeat colonoscopy in 5 years for surveillance. Mauri Pole, MD 12/12/2015 2:40:41 PM This report has been signed electronically.

## 2015-12-12 NOTE — Progress Notes (Signed)
Report to PACU, RN, vss, BBS= Clear.  

## 2015-12-12 NOTE — Patient Instructions (Signed)
YOU HAD AN ENDOSCOPIC PROCEDURE TODAY AT THE [facility]:   Refer to the procedure report that was given to you for any specific questions about what was found during the examination.  If the procedure report does not answer your questions, please call your gastroenterologist to clarify.  If you requested that your care partner not be given the details of your procedure findings, then the procedure report has been included in a sealed envelope for you to review at your convenience later.  YOU SHOULD EXPECT: Some feelings of bloating in the abdomen. Passage of more gas than usual.  Walking can help get rid of the air that was put into your GI tract during the procedure and reduce the bloating. If you had a lower endoscopy (such as a colonoscopy or flexible sigmoidoscopy) you may notice spotting of blood in your stool or on the toilet paper. If you underwent a bowel prep for your procedure, you may not have a normal bowel movement for a few days.  Please Note:  You might notice some irritation and congestion in your nose or some drainage.  This is from the oxygen used during your procedure.  There is no need for concern and it should clear up in a day or so.  SYMPTOMS TO REPORT IMMEDIATELY:   Following lower endoscopy (colonoscopy or flexible sigmoidoscopy):  Excessive amounts of blood in the stool  Significant tenderness or worsening of abdominal pains  Swelling of the abdomen that is new, acute  Fever of 100F or higher   For urgent or emergent issues, a gastroenterologist can be reached at any hour by calling [phone].   DIET: Your first meal following the procedure should be a small meal and then it is ok to progress to your normal diet. Heavy or fried foods are harder to digest and may make you feel nauseous or bloated.  Likewise, meals heavy in dairy and vegetables can increase bloating.  Drink plenty of fluids but you should avoid alcoholic beverages for 24  hours.  ACTIVITY:  You should plan to take it easy for the rest of today and you should NOT DRIVE or use heavy machinery until tomorrow (because of the sedation medicines used during the test).    FOLLOW UP: Our staff will call the number listed on your records the next business day following your procedure to check on you and address any questions or concerns that you may have regarding the information given to you following your procedure. If we do not reach you, we will leave a message.  However, if you are feeling well and you are not experiencing any problems, there is no need to return our call.  We will assume that you have returned to your regular daily activities without incident.  If any biopsies were taken you will be contacted by phone or by letter within the next 1-3 weeks.  Please call us at [phone] if you have not heard about the biopsies in 3 weeks.    SIGNATURES/CONFIDENTIALITY: You and/or your care partner have signed paperwork which will be entered into your electronic medical record.  These signatures attest to the fact that that the information above on your After Visit Summary has been reviewed and is understood.  Full responsibility of the confidentiality of this discharge information lies with you and/or your care-partner. 

## 2015-12-12 NOTE — Progress Notes (Signed)
Called to room to assist during endoscopic procedure.  Patient ID and intended procedure confirmed with present staff. Received instructions for my participation in the procedure from the performing physician.  

## 2015-12-15 ENCOUNTER — Telehealth: Payer: Self-pay | Admitting: *Deleted

## 2015-12-15 ENCOUNTER — Telehealth: Payer: Self-pay

## 2015-12-15 NOTE — Telephone Encounter (Signed)
  Follow up Call-  Call back number 12/12/2015  Post procedure Call Back phone  # [phone]  Permission to leave phone message Yes     Patient questions:  Do you have a fever, pain , or abdominal swelling? No. Pain Score  0 *  Have you tolerated food without any problems? Yes.    Have you been able to return to your normal activities? Yes.    Do you have any questions about your discharge instructions: Diet   No. Medications  No. Follow up visit  No.  Do you have questions or concerns about your Care? No.  Actions: * If pain score is 4 or above: No action needed, pain <4.

## 2015-12-15 NOTE — Telephone Encounter (Signed)
  Follow up Call-  Call back number 12/12/2015  Post procedure Call Back phone  # [phone]  Permission to leave phone message Yes     Patient questions:  Do you have a fever, pain , or abdominal swelling? No. Pain Score  0 *  Have you tolerated food without any problems? Yes.    Have you been able to return to your normal activities? Yes.    Do you have any questions about your discharge instructions: Diet   No. Medications  No. Follow up visit  No.  Do you have questions or concerns about your Care? No.  Actions: * If pain score is 4 or above: No action needed, pain <4.

## 2015-12-24 ENCOUNTER — Encounter: Payer: Self-pay | Admitting: Gastroenterology

## 2016-01-15 ENCOUNTER — Telehealth: Payer: Self-pay | Admitting: Gastroenterology

## 2016-01-15 DIAGNOSIS — R109 Unspecified abdominal pain: Secondary | ICD-10-CM

## 2016-01-15 DIAGNOSIS — R197 Diarrhea, unspecified: Secondary | ICD-10-CM

## 2016-01-15 NOTE — Telephone Encounter (Signed)
Please have patient do a GI stool pathogen panel and C.diff. CBC and BMP. Advise her to maintain adequate hydration.

## 2016-01-15 NOTE — Telephone Encounter (Signed)
Diarrhea 4 to 5 stools a day and every time she eats anything. Abdominal tenderness with any pressure. Feels queasy but no vomiting. Feeling weak and lethargic. Symptoms started when she was in [place]. Imodium has been tried but doesn't work. Sweats but unsure if she has had fever. First appointment is next Friday. Please advise.

## 2016-01-15 NOTE — Telephone Encounter (Signed)
Discussed the plan with the patient. Discussed in detail staying hydrated. She will come for labs tomorrow.

## 2016-01-16 ENCOUNTER — Other Ambulatory Visit (INDEPENDENT_AMBULATORY_CARE_PROVIDER_SITE_OTHER): Payer: 59

## 2016-01-16 ENCOUNTER — Other Ambulatory Visit: Payer: 59

## 2016-01-16 DIAGNOSIS — R197 Diarrhea, unspecified: Secondary | ICD-10-CM | POA: Diagnosis not present

## 2016-01-16 DIAGNOSIS — R109 Unspecified abdominal pain: Secondary | ICD-10-CM

## 2016-01-16 LAB — CBC WITH DIFFERENTIAL/PLATELET
Basophils Absolute: 0 10*3/uL (ref 0.0–0.1)
Basophils Relative: 0.3 % (ref 0.0–3.0)
EOS ABS: 0.1 10*3/uL (ref 0.0–0.7)
Eosinophils Relative: 0.8 % (ref 0.0–5.0)
HCT: 36.3 % (ref 36.0–46.0)
HEMOGLOBIN: 12 g/dL (ref 12.0–15.0)
LYMPHS PCT: 15.4 % (ref 12.0–46.0)
Lymphs Abs: 1 10*3/uL (ref 0.7–4.0)
MCHC: 33.2 g/dL (ref 30.0–36.0)
MCV: 87.3 fl (ref 78.0–100.0)
MONO ABS: 1 10*3/uL (ref 0.1–1.0)
Monocytes Relative: 15.2 % — ABNORMAL HIGH (ref 3.0–12.0)
Neutro Abs: 4.3 10*3/uL (ref 1.4–7.7)
Neutrophils Relative %: 68.3 % (ref 43.0–77.0)
Platelets: 235 10*3/uL (ref 150.0–400.0)
RBC: 4.16 Mil/uL (ref 3.87–5.11)
RDW: 13.4 % (ref 11.5–15.5)
WBC: 6.3 10*3/uL (ref 4.0–10.5)

## 2016-01-16 LAB — BASIC METABOLIC PANEL
BUN: 9 mg/dL (ref 6–23)
CHLORIDE: 102 meq/L (ref 96–112)
CO2: 32 meq/L (ref 19–32)
Calcium: 8.7 mg/dL (ref 8.4–10.5)
Creatinine, Ser: 0.82 mg/dL (ref 0.40–1.20)
GFR: 78.03 mL/min (ref >60.00)
Glucose, Bld: 131 mg/dL — ABNORMAL HIGH (ref 70–99)
POTASSIUM: 3.5 meq/L (ref 3.5–5.1)
SODIUM: 141 meq/L (ref 135–145)

## 2016-01-17 LAB — CLOSTRIDIUM DIFFICILE BY PCR: Toxigenic C. Difficile by PCR: NOT DETECTED

## 2016-01-19 LAB — GASTROINTESTINAL PATHOGEN PANEL PCR
C. difficile Tox A/B, PCR: NOT DETECTED
CRYPTOSPORIDIUM, PCR: NOT DETECTED
Campylobacter, PCR: DETECTED — CR
E COLI (STEC) STX1/STX2, PCR: NOT DETECTED
E coli (ETEC) LT/ST PCR: NOT DETECTED
E coli 0157, PCR: NOT DETECTED
GIARDIA LAMBLIA, PCR: NOT DETECTED
NOROVIRUS, PCR: NOT DETECTED
ROTAVIRUS, PCR: NOT DETECTED
Salmonella, PCR: NOT DETECTED
Shigella, PCR: NOT DETECTED

## 2016-01-20 ENCOUNTER — Encounter: Payer: Self-pay | Admitting: *Deleted

## 2016-01-20 ENCOUNTER — Telehealth: Payer: Self-pay | Admitting: Gastroenterology

## 2016-01-20 MED ORDER — LEVOFLOXACIN 750 MG PO TABS: 750.0000 mg | ORAL_TABLET | Freq: Every day | ORAL | Status: DC

## 2016-01-20 NOTE — Telephone Encounter (Signed)
Spoke with the patient. She has continued with diarrhea. She will take the 5 days of Levaquin as directed. OV if she fails to improve or acutely worsens.

## 2016-01-20 NOTE — Telephone Encounter (Signed)
Stool testing was positive for campylobacter.  Please call her.  This is usually a self limited infection. If she is still having significant diarrhea, then she needs levaquin 750mg , once daily for 5 days.  No refills.    Thanks

## 2016-01-22 ENCOUNTER — Ambulatory Visit: Payer: 59 | Admitting: Physician Assistant

## 2017-12-29 ENCOUNTER — Other Ambulatory Visit: Payer: Self-pay | Admitting: Obstetrics and Gynecology

## 2017-12-29 DIAGNOSIS — R928 Other abnormal and inconclusive findings on diagnostic imaging of breast: Secondary | ICD-10-CM

## 2018-01-04 ENCOUNTER — Ambulatory Visit: Payer: 59

## 2018-01-04 ENCOUNTER — Ambulatory Visit
Admission: RE | Admit: 2018-01-04 | Discharge: 2018-01-04 | Disposition: A | Discharge status: Home / Self Care | Payer: 59 | Source: Ambulatory Visit | Attending: Obstetrics and Gynecology | Admitting: Obstetrics and Gynecology

## 2018-01-04 DIAGNOSIS — R928 Other abnormal and inconclusive findings on diagnostic imaging of breast: Secondary | ICD-10-CM

## 2019-06-16 IMAGING — MG DIGITAL DIAGNOSTIC UNILATERAL LEFT MAMMOGRAM WITH TOMO AND CAD
4 series · 4 of 12 positions shown · non-contrast
Comparison: Previous exam(s).

CLINICAL DATA: The patient was called back for left breast
asymmetry

EXAM:
DIGITAL DIAGNOSTIC UNILATERAL LEFT MAMMOGRAM WITH CAD AND TOMO

[L CC synth-2D]
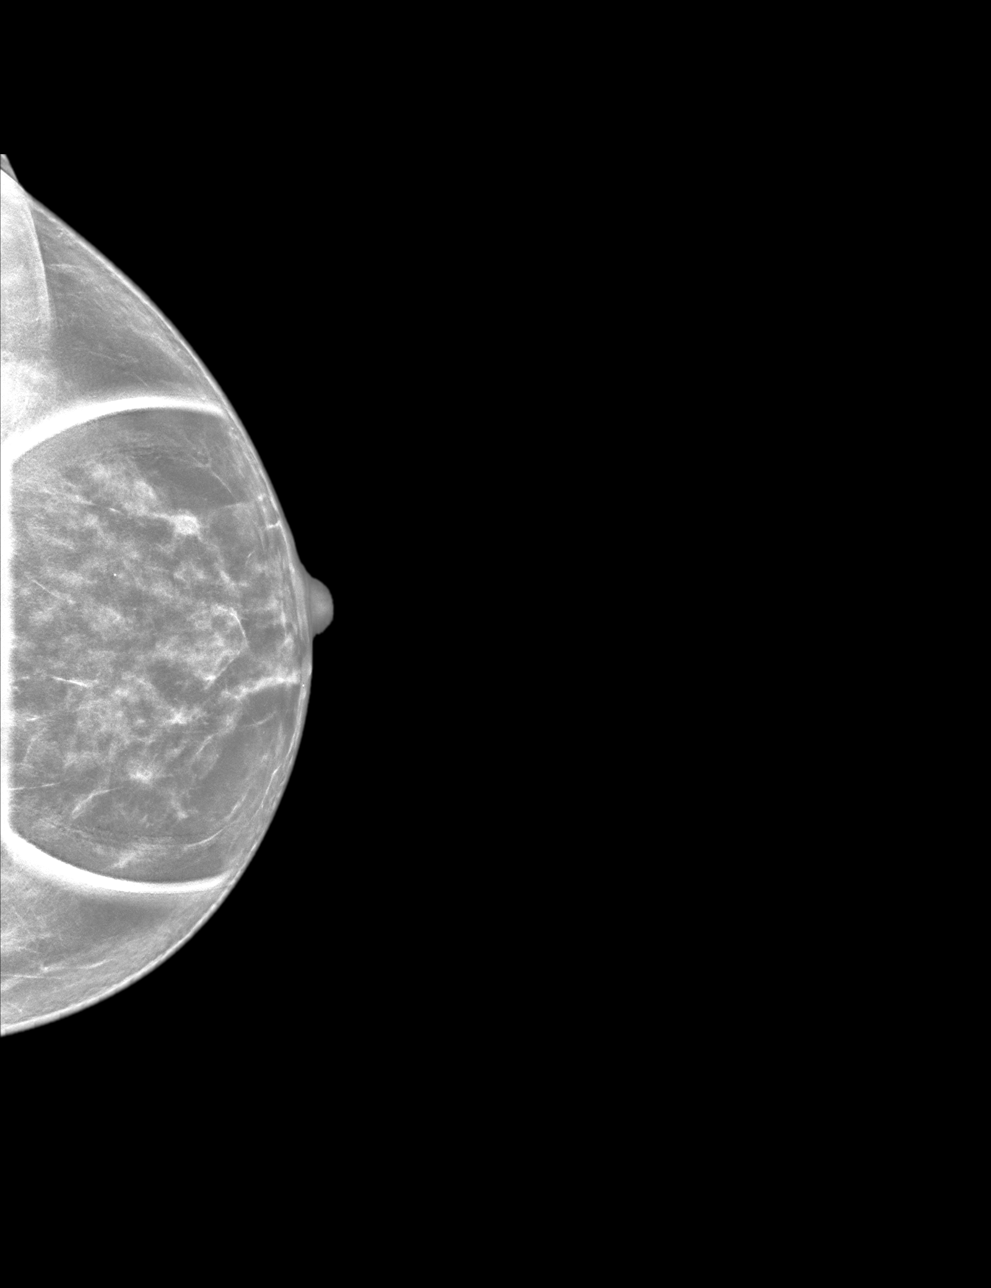

[L ML synth-2D]
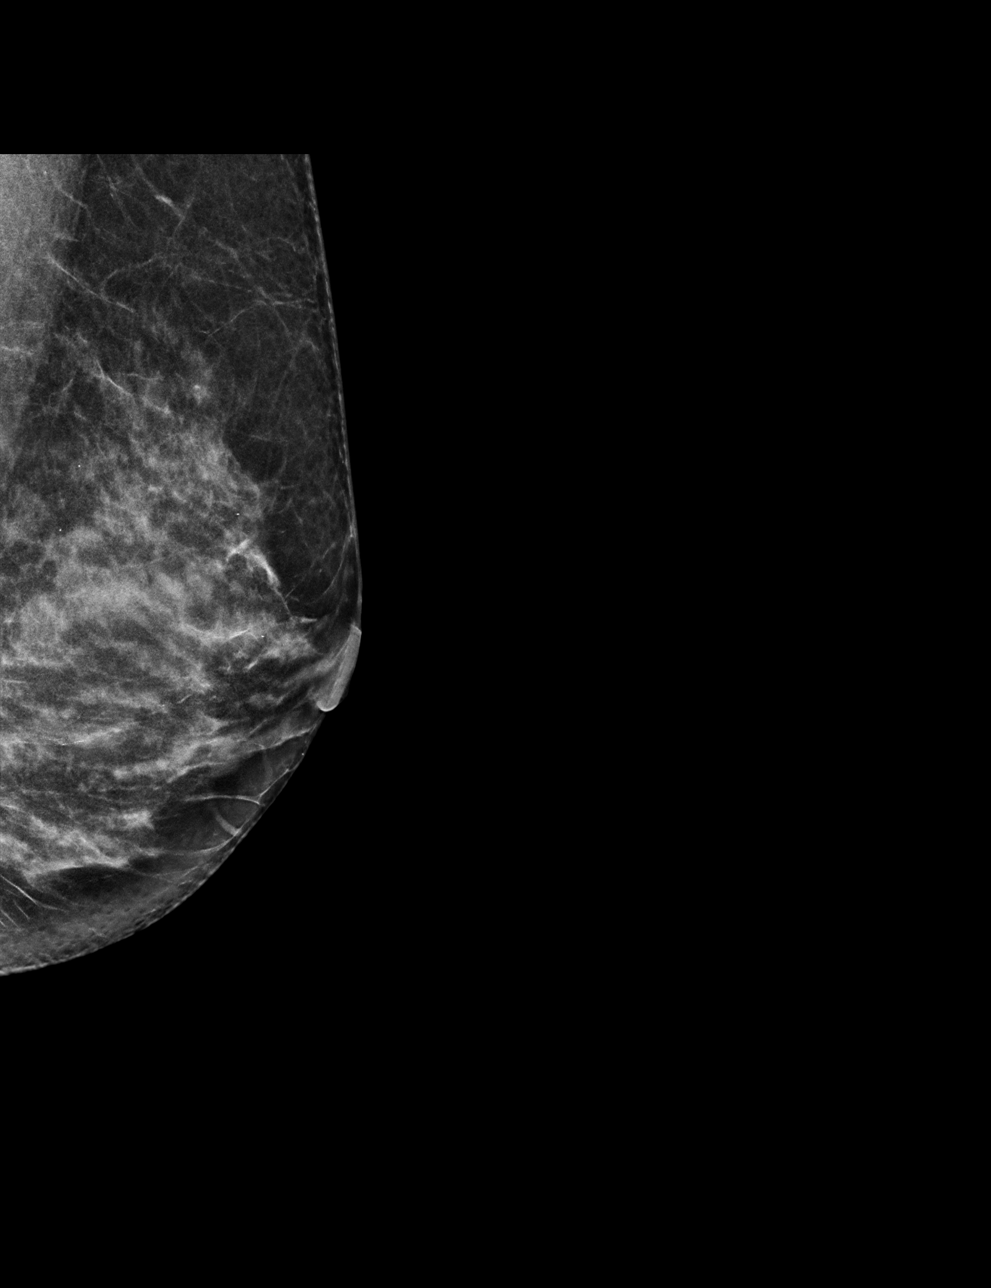

[L ML tomo · tomo slice 29/56.0]
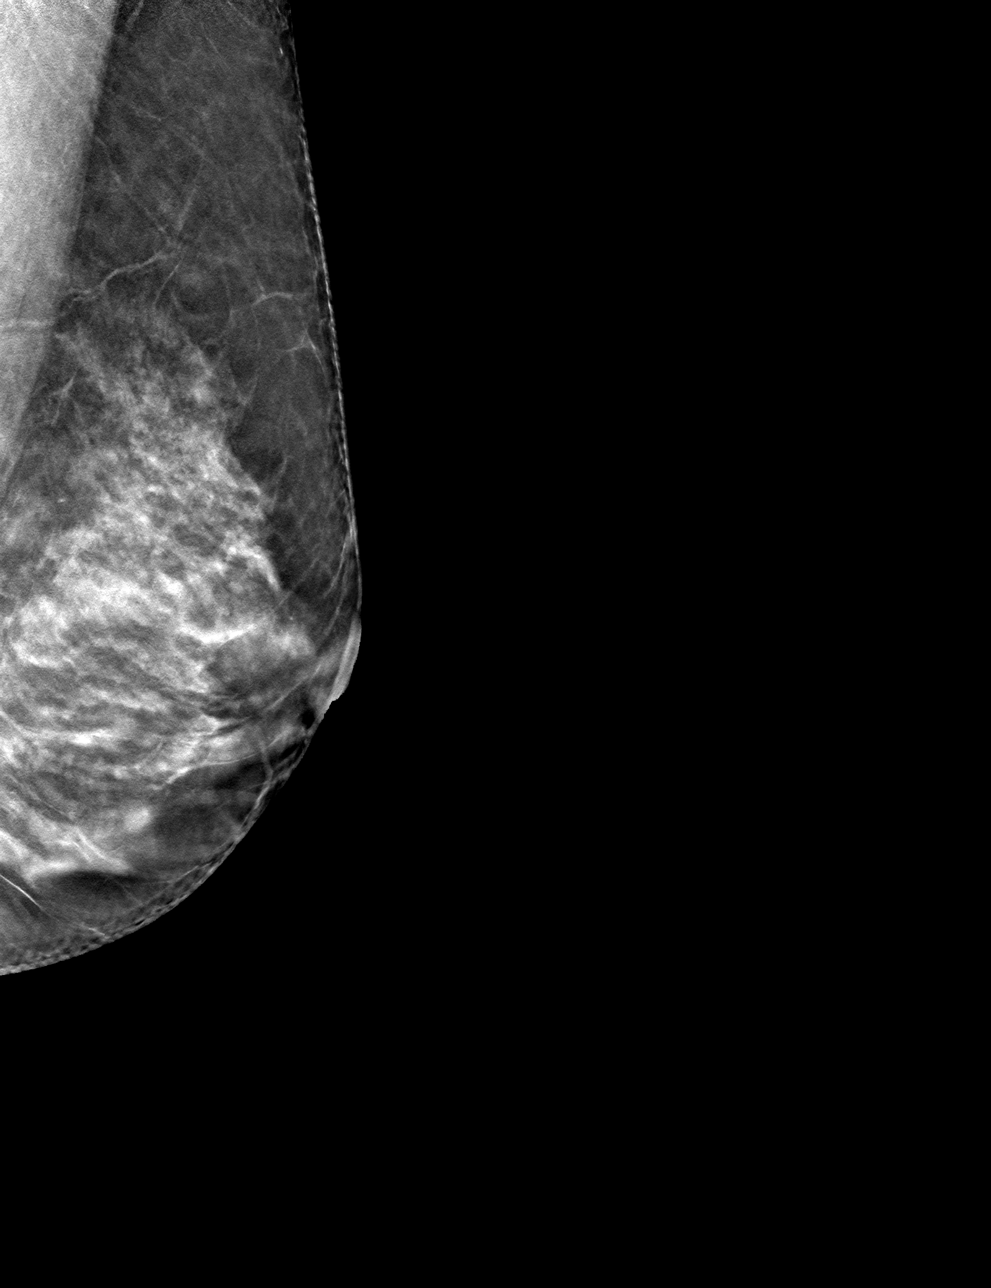

[L CC tomo · tomo slice 29/58.0]
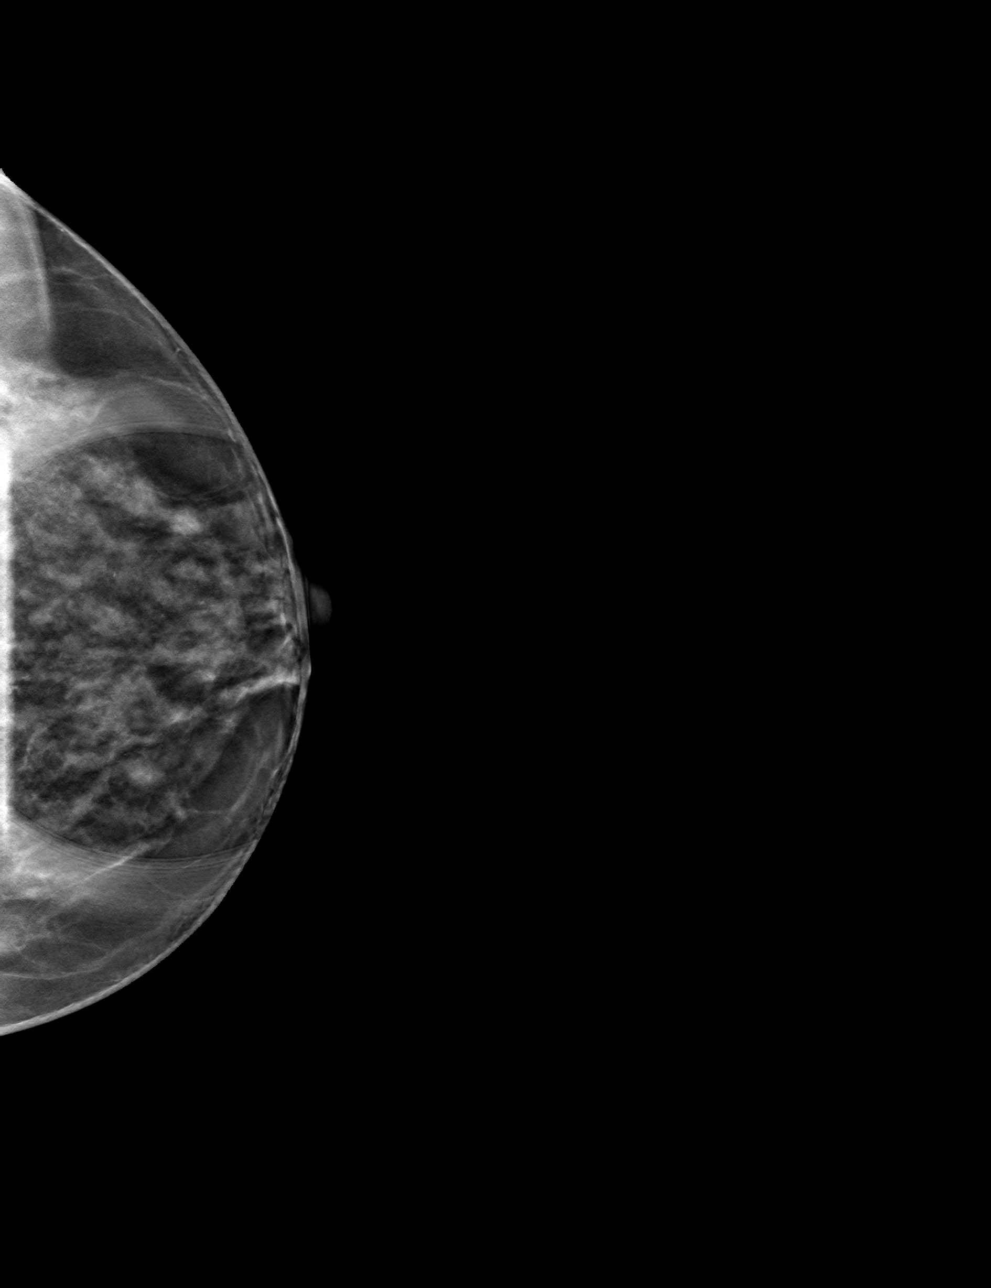

[4 of 12 positions shown; findings below may reference images not displayed]

ACR Breast Density Category c: The breast tissue is heterogeneously
dense, which may obscure small masses.
FINDINGS: The left breast asymmetry resolves on additional imaging.

Mammographic images were processed with CAD.
IMPRESSION: No mammographic evidence of malignancy.

RECOMMENDATION:
Annual screening mammography.

I have discussed the findings and recommendations with the patient.
Results were also provided in writing at the conclusion of the
visit. If applicable, a reminder letter will be sent to the patient
regarding the next appointment.

BI-RADS CATEGORY  2: Benign.

## 2020-11-19 ENCOUNTER — Encounter: Payer: Self-pay | Admitting: Gastroenterology

## 2021-02-04 ENCOUNTER — Ambulatory Visit (AMBULATORY_SURGERY_CENTER): Payer: No Typology Code available for payment source

## 2021-02-04 ENCOUNTER — Other Ambulatory Visit: Payer: Self-pay

## 2021-02-04 VITALS — Ht 62.0 in | Wt 123.0 lb

## 2021-02-04 DIAGNOSIS — Z800 Family history of malignant neoplasm of digestive organs: Secondary | ICD-10-CM

## 2021-02-04 MED ORDER — PEG 3350-KCL-NA BICARB-NACL 420 G PO SOLR: 4000.0000 mL | Freq: Once | ORAL | 0 refills | Status: AC

## 2021-02-04 NOTE — Progress Notes (Signed)
Patient's pre-visit was done today over the phone with the patient due to COVID-19 pandemic. Name,DOB and address verified. Insurance verified. Patient denies any allergies to Eggs and Soy. Patient denies any problems with anesthesia/sedation. Patient denies taking diet pills or blood thinners. Packet of Prep instructions mailed to patient including a copy of a consent form-pt is aware. Patient understands to call us back with any questions or concerns. Patient is aware of our care-partner policy and VVOHY-07 safety protocol.   The patient is COVID-19 vaccinated, per patient.

## 2021-02-18 ENCOUNTER — Ambulatory Visit (AMBULATORY_SURGERY_CENTER): Payer: No Typology Code available for payment source | Admitting: Gastroenterology

## 2021-02-18 ENCOUNTER — Encounter: Payer: Self-pay | Admitting: Gastroenterology

## 2021-02-18 ENCOUNTER — Other Ambulatory Visit: Payer: Self-pay

## 2021-02-18 VITALS — BP 136/100 | HR 50 | Temp 98.4°F | Resp 13 | Ht 62.0 in | Wt 123.0 lb

## 2021-02-18 DIAGNOSIS — D125 Benign neoplasm of sigmoid colon: Secondary | ICD-10-CM

## 2021-02-18 DIAGNOSIS — Z800 Family history of malignant neoplasm of digestive organs: Secondary | ICD-10-CM

## 2021-02-18 DIAGNOSIS — K635 Polyp of colon: Secondary | ICD-10-CM

## 2021-02-18 DIAGNOSIS — Z86010 Personal history of colonic polyps: Secondary | ICD-10-CM

## 2021-02-18 DIAGNOSIS — D123 Benign neoplasm of transverse colon: Secondary | ICD-10-CM

## 2021-02-18 DIAGNOSIS — D122 Benign neoplasm of ascending colon: Secondary | ICD-10-CM

## 2021-02-18 MED ORDER — SODIUM CHLORIDE 0.9 % IV SOLN: 500.0000 mL | Freq: Once | INTRAVENOUS | Status: DC

## 2021-02-18 NOTE — Op Note (Signed)
[facility] Patient Name: Kendra Walter Procedure Date: 02/18/2021 1:39 PM MRN: [id number] Endoscopist: Mauri Pole , MD Age: 55 Referring MD:  Date of Birth: [birth year] Gender: Female Account #: [id number] Procedure:                Colonoscopy Indications:              Screening in patient at increased risk: Family                            history of 1st-degree relative with colorectal                            cancer before age 93 years, High risk colon cancer                            surveillance: Personal history of colonic polyps Medicines:                Monitored Anesthesia Care Procedure:                Pre-Anesthesia Assessment:                           - Prior to the procedure, a History and Physical                            was performed, and patient medications and                            allergies were reviewed. The patient's tolerance of                            previous anesthesia was also reviewed. The risks                            and benefits of the procedure and the sedation                            options and risks were discussed with the patient.                            All questions were answered, and informed consent                            was obtained. Prior Anticoagulants: The patient has                            taken no previous anticoagulant or antiplatelet                            agents. ASA Grade Assessment: II - A patient with                            mild systemic disease. After reviewing the risks  and benefits, the patient was deemed in                            satisfactory condition to undergo the procedure.                           After obtaining informed consent, the colonoscope                            was passed under direct vision. Throughout the                            procedure, the patient's blood pressure, pulse, and                            oxygen  saturations were monitored continuously. The                            PCF-HQ190L Colonoscope was introduced through the                            anus and advanced to the the cecum, identified by                            appendiceal orifice and ileocecal valve. The                            colonoscopy was performed without difficulty. The                            patient tolerated the procedure well. The quality                            of the bowel preparation was excellent. The                            ileocecal valve, appendiceal orifice, and rectum                            were photographed. Scope In: 1:44:56 PM Scope Out: 2:11:20 PM Scope Withdrawal Time: 0 hours 19 minutes 17 seconds  Total Procedure Duration: 0 hours 26 minutes 24 seconds  Findings:                 The perianal and digital rectal examinations were                            normal.                           Two sessile polyps were found in the sigmoid colon.                            The polyps were 1 to 2 mm in size. These polyps  were removed with a cold biopsy forceps. Resection                            and retrieval were complete.                           Three sessile polyps were found in the transverse                            colon and ascending colon. The polyps were 6 to 10                            mm in size. These polyps were removed with a cold                            snare. Resection and retrieval were complete.                           Non-bleeding external and internal hemorrhoids were                            found during retroflexion. The hemorrhoids were                            medium-sized. Complications:            No immediate complications. Estimated Blood Loss:     Estimated blood loss was minimal. Impression:               - Two 1 to 2 mm polyps in the sigmoid colon,                            removed with a cold biopsy forceps.  Resected and                            retrieved.                           - Three 6 to 10 mm polyps in the transverse colon                            and in the ascending colon, removed with a cold                            snare. Resected and retrieved.                           - Non-bleeding external and internal hemorrhoids. Recommendation:           - Patient has a contact number available for                            emergencies. The signs and symptoms of potential  delayed complications were discussed with the                            patient. Return to normal activities tomorrow.                            Written discharge instructions were provided to the                            patient.                           - Resume previous diet.                           - Continue present medications.                           - Await pathology results.                           - Repeat colonoscopy in 3 - 5 years for                            surveillance based on pathology results. Mauri Pole, MD 02/18/2021 2:22:03 PM This report has been signed electronically.

## 2021-02-18 NOTE — Patient Instructions (Signed)
Handouts given:  polyps, hemorrhoids Resume previous diet Continue current medications Await pathology results  YOU HAD AN ENDOSCOPIC PROCEDURE TODAY AT [place]:   Refer to the procedure report that was given to you for any specific questions about what was found during the examination.  If the procedure report does not answer your questions, please call your gastroenterologist to clarify.  If you requested that your care partner not be given the details of your procedure findings, then the procedure report has been included in a sealed envelope for you to review at your convenience later.  YOU SHOULD EXPECT: Some feelings of bloating in the abdomen. Passage of more gas than usual.  Walking can help get rid of the air that was put into your GI tract during the procedure and reduce the bloating. If you had a lower endoscopy (such as a colonoscopy or flexible sigmoidoscopy) you may notice spotting of blood in your stool or on the toilet paper. If you underwent a bowel prep for your procedure, you may not have a normal bowel movement for a few days.  Please Note:  You might notice some irritation and congestion in your nose or some drainage.  This is from the oxygen used during your procedure.  There is no need for concern and it should clear up in a day or so.  SYMPTOMS TO REPORT IMMEDIATELY:  Following lower endoscopy (colonoscopy or flexible sigmoidoscopy):  Excessive amounts of blood in the stool  Significant tenderness or worsening of abdominal pains  Swelling of the abdomen that is new, acute  Fever of 100F or higher  For urgent or emergent issues, a gastroenterologist can be reached at any hour by calling [phone]. Do not use MyChart messaging for urgent concerns.   DIET:  We do recommend a small meal at first, but then you may proceed to your regular diet.  Drink plenty of fluids but you should avoid alcoholic beverages for 24 hours.  ACTIVITY:  You should  plan to take it easy for the rest of today and you should NOT DRIVE or use heavy machinery until tomorrow (because of the sedation medicines used during the test).    FOLLOW UP: Our staff will call the number listed on your records 48-72 hours following your procedure to check on you and address any questions or concerns that you may have regarding the information given to you following your procedure. If we do not reach you, we will leave a message.  We will attempt to reach you two times.  During this call, we will ask if you have developed any symptoms of COVID 19. If you develop any symptoms (ie: fever, flu-like symptoms, shortness of breath, cough etc.) before then, please call [phone].  If you test positive for Covid 19 in the 2 weeks post procedure, please call and report this information to [place].    If any biopsies were taken you will be contacted by phone or by letter within the next 1-3 weeks.  Please call us at [phone] if you have not heard about the biopsies in 3 weeks.   SIGNATURES/CONFIDENTIALITY: You and/or your care partner have signed paperwork which will be entered into your electronic medical record.  These signatures attest to the fact that that the information above on your After Visit Summary has been reviewed and is understood.  Full responsibility of the confidentiality of this discharge information lies with you and/or your care-partner.

## 2021-02-18 NOTE — Progress Notes (Signed)
Called to room to assist during endoscopic procedure.  Patient ID and intended procedure confirmed with present staff. Received instructions for my participation in the procedure from the performing physician.  

## 2021-02-18 NOTE — Progress Notes (Signed)
Report given to PACU, vss 

## 2021-02-18 NOTE — Progress Notes (Signed)
Pt's states no medical or surgical changes since previsit or office visit. VS by Amparo Bristol.

## 2021-02-20 ENCOUNTER — Telehealth: Payer: Self-pay

## 2021-02-20 NOTE — Telephone Encounter (Signed)
LVM

## 2021-03-06 ENCOUNTER — Other Ambulatory Visit: Payer: Self-pay | Admitting: Obstetrics and Gynecology

## 2021-03-06 DIAGNOSIS — Z803 Family history of malignant neoplasm of breast: Secondary | ICD-10-CM

## 2021-03-09 ENCOUNTER — Encounter: Payer: Self-pay | Admitting: Gastroenterology

## 2021-08-24 ENCOUNTER — Ambulatory Visit: Payer: No Typology Code available for payment source

## 2021-09-08 ENCOUNTER — Ambulatory Visit
Admission: RE | Admit: 2021-09-08 | Discharge: 2021-09-08 | Disposition: A | Discharge status: Home / Self Care | Payer: No Typology Code available for payment source | Source: Ambulatory Visit | Attending: Obstetrics and Gynecology | Admitting: Obstetrics and Gynecology

## 2021-09-08 DIAGNOSIS — Z803 Family history of malignant neoplasm of breast: Secondary | ICD-10-CM

## 2021-09-08 MED ORDER — GADOBUTROL 1 MMOL/ML IV SOLN
5.0000 mL | Freq: Once | INTRAVENOUS | Status: AC | PRN
  Administered 2021-09-08: 5 mL via INTRAVENOUS

## 2021-09-09 ENCOUNTER — Other Ambulatory Visit: Payer: Self-pay | Admitting: Obstetrics and Gynecology

## 2021-09-09 DIAGNOSIS — R9389 Abnormal findings on diagnostic imaging of other specified body structures: Secondary | ICD-10-CM

## 2021-09-11 ENCOUNTER — Ambulatory Visit
Admission: RE | Admit: 2021-09-11 | Discharge: 2021-09-11 | Disposition: A | Discharge status: Home / Self Care | Payer: No Typology Code available for payment source | Source: Ambulatory Visit | Attending: Obstetrics and Gynecology | Admitting: Obstetrics and Gynecology

## 2021-09-11 ENCOUNTER — Other Ambulatory Visit: Payer: Self-pay

## 2021-09-11 DIAGNOSIS — R9389 Abnormal findings on diagnostic imaging of other specified body structures: Secondary | ICD-10-CM

## 2021-09-11 MED ORDER — GADOBUTROL 1 MMOL/ML IV SOLN
5.0000 mL | Freq: Once | INTRAVENOUS | Status: AC | PRN
  Administered 2021-09-11: 5 mL via INTRAVENOUS

## 2022-04-19 ENCOUNTER — Other Ambulatory Visit: Payer: Self-pay | Admitting: Obstetrics and Gynecology

## 2022-04-19 DIAGNOSIS — Z803 Family history of malignant neoplasm of breast: Secondary | ICD-10-CM

## 2022-05-06 ENCOUNTER — Ambulatory Visit
Admission: RE | Admit: 2022-05-06 | Discharge: 2022-05-06 | Disposition: A | Discharge status: Home / Self Care | Payer: No Typology Code available for payment source | Source: Ambulatory Visit | Attending: Obstetrics and Gynecology | Admitting: Obstetrics and Gynecology

## 2022-05-06 DIAGNOSIS — Z803 Family history of malignant neoplasm of breast: Secondary | ICD-10-CM

## 2022-05-06 MED ORDER — GADOBUTROL 1 MMOL/ML IV SOLN
5.0000 mL | Freq: Once | INTRAVENOUS | Status: AC | PRN
  Administered 2022-05-06: 5 mL via INTRAVENOUS

## 2022-05-10 ENCOUNTER — Other Ambulatory Visit: Payer: Self-pay | Admitting: Obstetrics and Gynecology

## 2022-05-10 DIAGNOSIS — K769 Liver disease, unspecified: Secondary | ICD-10-CM

## 2022-05-19 ENCOUNTER — Ambulatory Visit
Admission: RE | Admit: 2022-05-19 | Discharge: 2022-05-19 | Disposition: A | Discharge status: Home / Self Care | Payer: No Typology Code available for payment source | Source: Ambulatory Visit | Attending: Obstetrics and Gynecology | Admitting: Obstetrics and Gynecology

## 2022-05-19 DIAGNOSIS — K769 Liver disease, unspecified: Secondary | ICD-10-CM

## 2022-05-19 MED ORDER — GADOPICLENOL 0.5 MMOL/ML IV SOLN
5.0000 mL | Freq: Once | INTRAVENOUS | Status: AC | PRN
  Administered 2022-05-19: 5 mL via INTRAVENOUS

## 2022-05-21 ENCOUNTER — Other Ambulatory Visit: Payer: No Typology Code available for payment source

## 2022-05-28 ENCOUNTER — Telehealth: Payer: Self-pay

## 2022-05-28 NOTE — Telephone Encounter (Signed)
Patient on the schedule to be seen 06/09/22 with Alonza Bogus. PA-C. The patient is being seen due to an abnormal MRI of the liver where highly suspicious lesion were found. The liver lesions were initially an incidental finding on an MRI of the breast. Dr Gaetano Net calls to discuss the patient. Inquires if there is any work up that could be started now?

## 2022-05-31 NOTE — Telephone Encounter (Signed)
Agree with Oncology consult and possible IR liver biopsy to identify to primary lesion

## 2022-05-31 NOTE — Telephone Encounter (Signed)
Patient called and canceled her appointment. She told our scheduler she is referred to oncology.

## 2022-06-09 ENCOUNTER — Ambulatory Visit: Payer: No Typology Code available for payment source | Admitting: Gastroenterology

## 2023-05-12 ENCOUNTER — Other Ambulatory Visit: Payer: Self-pay | Admitting: Obstetrics and Gynecology

## 2023-05-12 DIAGNOSIS — Z803 Family history of malignant neoplasm of breast: Secondary | ICD-10-CM

## 2023-10-11 ENCOUNTER — Ambulatory Visit
Admission: RE | Admit: 2023-10-11 | Discharge: 2023-10-11 | Disposition: A | Discharge status: Home / Self Care | Payer: No Typology Code available for payment source | Source: Ambulatory Visit | Attending: Obstetrics and Gynecology | Admitting: Obstetrics and Gynecology

## 2023-10-11 DIAGNOSIS — Z803 Family history of malignant neoplasm of breast: Secondary | ICD-10-CM

## 2023-10-11 MED ORDER — GADOPICLENOL 0.5 MMOL/ML IV SOLN
6.0000 mL | Freq: Once | INTRAVENOUS | Status: AC | PRN
  Administered 2023-10-11: 6 mL via INTRAVENOUS
